# Patient Record
Sex: Female | Born: 1955 | Race: White | Hispanic: No | Marital: Married | Smoking: Former smoker
Health system: Southern US, Community
[De-identification: ages and names within clinical notes are randomized; demographics above are authoritative.]

## PROBLEM LIST (undated history)

## (undated) DIAGNOSIS — E119 Type 2 diabetes mellitus without complications: Secondary | ICD-10-CM

## (undated) HISTORY — PX: TUBAL LIGATION: SHX77

## (undated) HISTORY — PX: CHOLECYSTECTOMY: SHX55

## (undated) HISTORY — PX: APPENDECTOMY: SHX54

---

## 2015-07-08 ENCOUNTER — Other Ambulatory Visit: Payer: Self-pay | Admitting: Internal Medicine

## 2015-07-08 DIAGNOSIS — Z1231 Encounter for screening mammogram for malignant neoplasm of breast: Secondary | ICD-10-CM

## 2015-07-30 ENCOUNTER — Ambulatory Visit
Admission: RE | Admit: 2015-07-30 | Discharge: 2015-07-30 | Disposition: A | Discharge status: Home / Self Care | Payer: 59 | Source: Ambulatory Visit | Attending: Internal Medicine | Admitting: Internal Medicine

## 2015-07-30 DIAGNOSIS — Z1231 Encounter for screening mammogram for malignant neoplasm of breast: Secondary | ICD-10-CM

## 2018-04-27 ENCOUNTER — Encounter (HOSPITAL_COMMUNITY): Payer: Self-pay

## 2018-04-27 ENCOUNTER — Emergency Department (HOSPITAL_COMMUNITY): Payer: PRIVATE HEALTH INSURANCE

## 2018-04-27 ENCOUNTER — Other Ambulatory Visit: Payer: Self-pay

## 2018-04-27 ENCOUNTER — Emergency Department (HOSPITAL_COMMUNITY)
Admission: EM | Admit: 2018-04-27 | Discharge: 2018-04-28 | Disposition: A | Discharge status: Home / Self Care | Payer: PRIVATE HEALTH INSURANCE | Attending: Emergency Medicine | Admitting: Emergency Medicine

## 2018-04-27 DIAGNOSIS — Z5321 Procedure and treatment not carried out due to patient leaving prior to being seen by health care provider: Secondary | ICD-10-CM | POA: Diagnosis not present

## 2018-04-27 DIAGNOSIS — R079 Chest pain, unspecified: Secondary | ICD-10-CM | POA: Diagnosis not present

## 2018-04-27 HISTORY — DX: Type 2 diabetes mellitus without complications: E11.9

## 2018-04-27 LAB — CBC
HCT: 43.6 % (ref 36.0–46.0)
Hemoglobin: 13.7 g/dL (ref 12.0–15.0)
MCH: 28 pg (ref 26.0–34.0)
MCHC: 31.4 g/dL (ref 30.0–36.0)
MCV: 89.2 fL (ref 78.0–100.0)
PLATELETS: 391 10*3/uL (ref 150–400)
RBC: 4.89 MIL/uL (ref 3.87–5.11)
RDW: 12.8 % (ref 11.5–15.5)
WBC: 8 10*3/uL (ref 4.0–10.5)

## 2018-04-27 LAB — BASIC METABOLIC PANEL
Anion gap: 12 (ref 5–15)
BUN: 19 mg/dL (ref 6–20)
CALCIUM: 9.4 mg/dL (ref 8.9–10.3)
CO2: 24 mmol/L (ref 22–32)
Chloride: 102 mmol/L (ref 98–111)
Creatinine, Ser: 0.67 mg/dL (ref 0.44–1.00)
GFR calc Af Amer: >60 mL/min (ref >60)
GLUCOSE: 274 mg/dL — AB (ref 70–99)
Potassium: 3.9 mmol/L (ref 3.5–5.1)
SODIUM: 138 mmol/L (ref 135–145)

## 2018-04-27 LAB — I-STAT BETA HCG BLOOD, ED (MC, WL, AP ONLY)

## 2018-04-27 LAB — I-STAT TROPONIN, ED: TROPONIN I, POC: 0.02 ng/mL (ref 0.00–0.08)

## 2018-04-27 NOTE — ED Notes (Signed)
Pt called for recheck vitals, no answer 

## 2018-04-27 NOTE — ED Notes (Signed)
Results reviewed.  No changes in acuity at this time 

## 2018-04-27 NOTE — ED Triage Notes (Signed)
Pt states that since 2 pm today she has been having central chest pressure with SOB with nausea, dizziness and radiation to jaw.

## 2018-04-27 NOTE — ED Notes (Signed)
Called patient to reassess vitals x3 and had no answer. 

## 2018-04-28 NOTE — ED Notes (Signed)
Called patient to reassess vitals x3 and had no answer. 

## 2019-11-27 IMAGING — DX DG CHEST 2V
2 series · 2 of 2 positions shown · non-contrast
Comparison: 01/07/2017

CLINICAL DATA: Central chest pain for 1 day.

EXAM:
CHEST - 2 VIEW

[w chest pa]
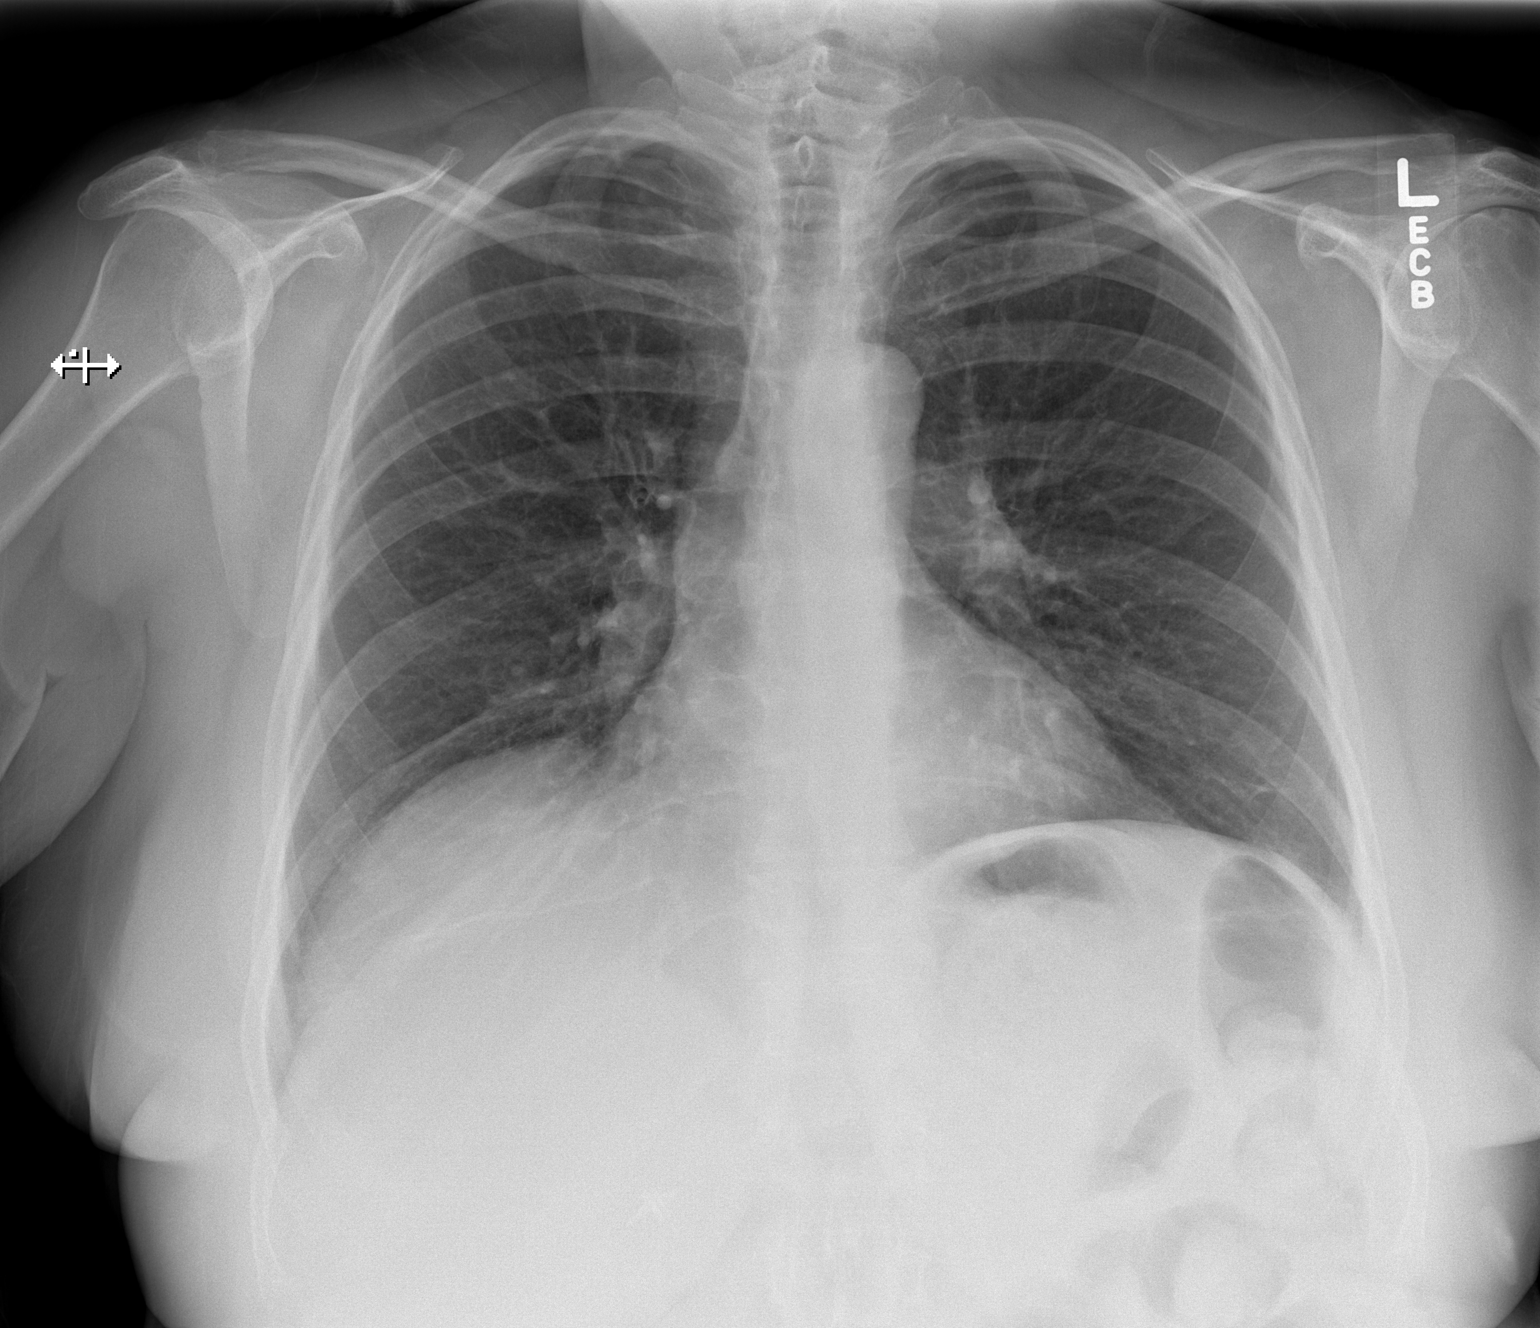

[w chest lat]
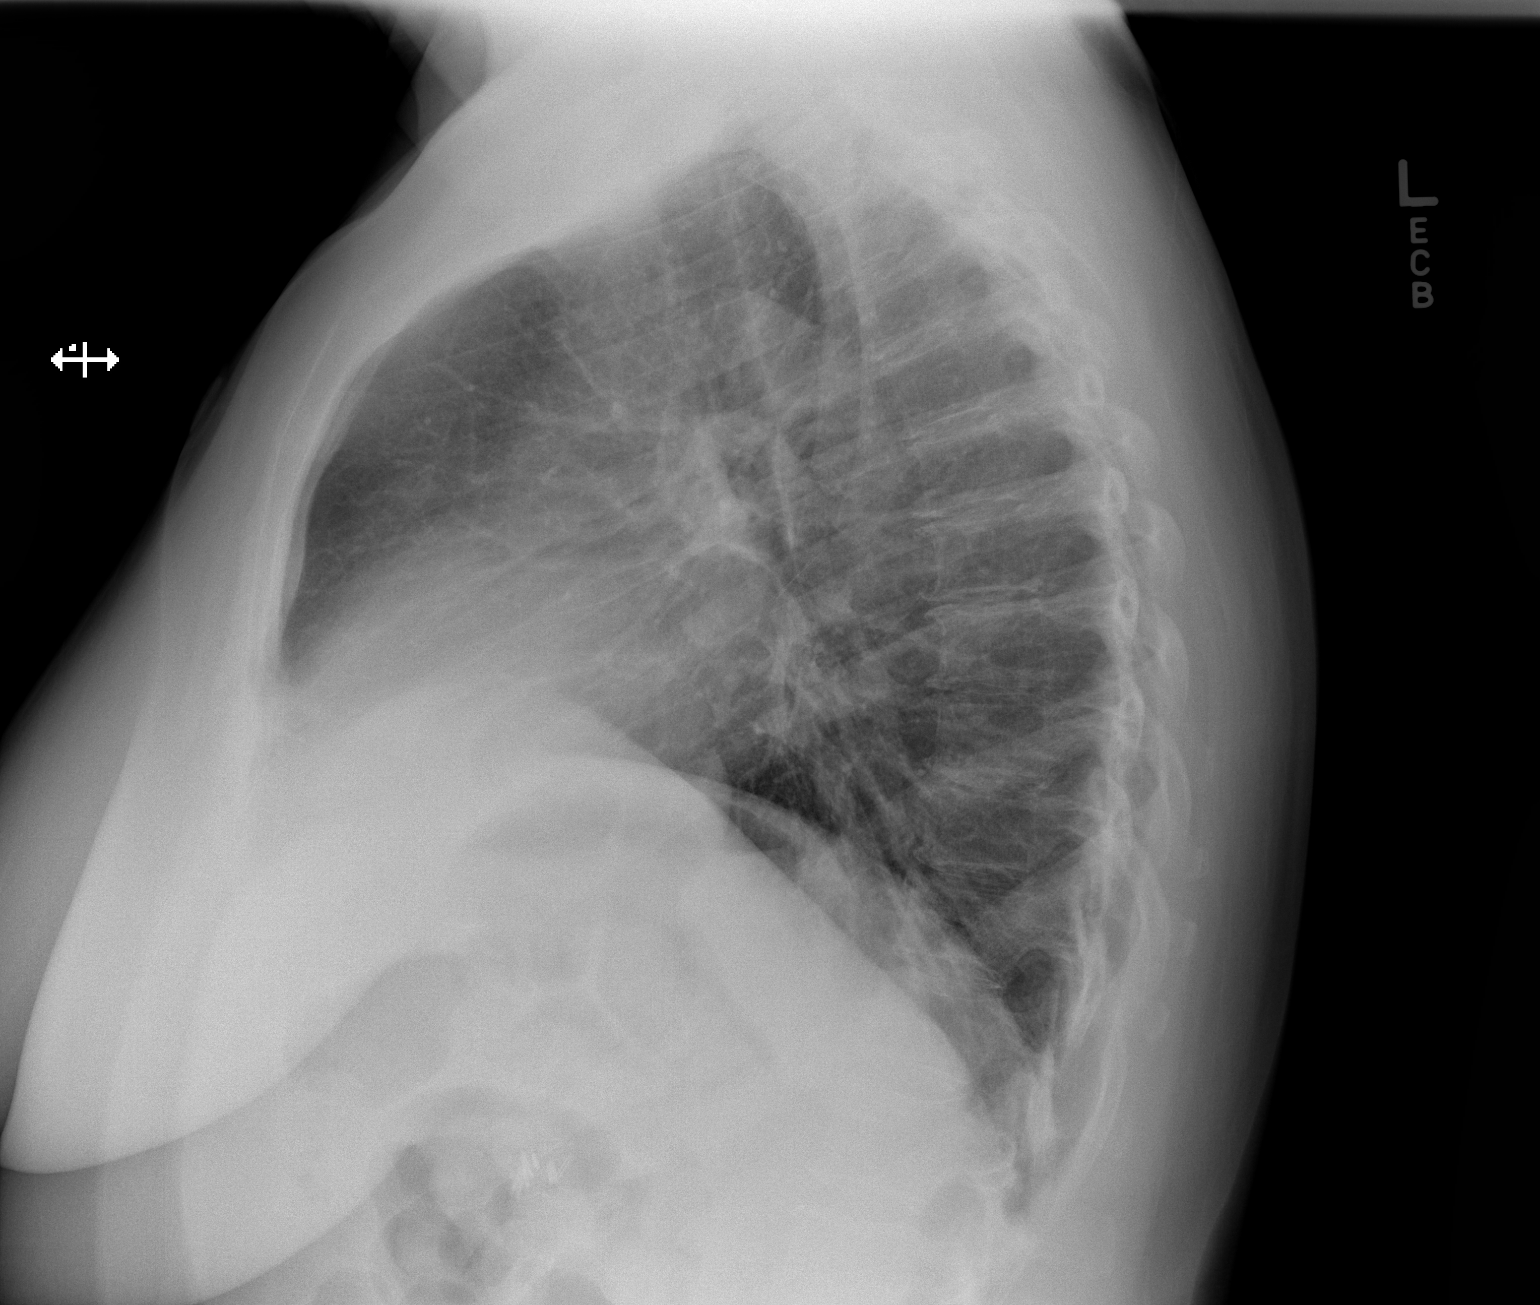

[2 of 2 positions shown; findings below may reference images not displayed]

FINDINGS: The cardiomediastinal contours are normal. The lungs are clear.
Minimal eventration of right hemidiaphragm, unchanged. Pulmonary
vasculature is normal. No consolidation, pleural effusion, or
pneumothorax. No acute osseous abnormalities are seen.
IMPRESSION: No acute chest finding.
